# Patient Record
Sex: Female | Born: 1985 | Race: White | Hispanic: No | Smoking: Current every day smoker
Health system: Southern US, Community
[De-identification: ages and names within clinical notes are randomized; demographics above are authoritative.]

## PROBLEM LIST (undated history)

## (undated) DIAGNOSIS — E079 Disorder of thyroid, unspecified: Secondary | ICD-10-CM

---

## 2019-09-21 DIAGNOSIS — M542 Cervicalgia: Secondary | ICD-10-CM | POA: Insufficient documentation

## 2019-09-21 DIAGNOSIS — Z720 Tobacco use: Secondary | ICD-10-CM | POA: Insufficient documentation

## 2019-12-07 DIAGNOSIS — E039 Hypothyroidism, unspecified: Secondary | ICD-10-CM | POA: Insufficient documentation

## 2019-12-07 DIAGNOSIS — F410 Panic disorder [episodic paroxysmal anxiety] without agoraphobia: Secondary | ICD-10-CM | POA: Insufficient documentation

## 2020-07-28 DIAGNOSIS — F329 Major depressive disorder, single episode, unspecified: Secondary | ICD-10-CM | POA: Insufficient documentation

## 2021-01-16 DIAGNOSIS — F39 Unspecified mood [affective] disorder: Secondary | ICD-10-CM | POA: Insufficient documentation

## 2021-01-16 DIAGNOSIS — R7303 Prediabetes: Secondary | ICD-10-CM | POA: Insufficient documentation

## 2021-03-05 DIAGNOSIS — N83209 Unspecified ovarian cyst, unspecified side: Secondary | ICD-10-CM | POA: Insufficient documentation

## 2021-03-05 DIAGNOSIS — Z98891 History of uterine scar from previous surgery: Secondary | ICD-10-CM | POA: Insufficient documentation

## 2021-06-16 DIAGNOSIS — F419 Anxiety disorder, unspecified: Secondary | ICD-10-CM | POA: Insufficient documentation

## 2021-06-23 DIAGNOSIS — Z8632 Personal history of gestational diabetes: Secondary | ICD-10-CM | POA: Insufficient documentation

## 2021-06-29 DIAGNOSIS — O99119 Other diseases of the blood and blood-forming organs and certain disorders involving the immune mechanism complicating pregnancy, unspecified trimester: Secondary | ICD-10-CM | POA: Insufficient documentation

## 2021-08-27 DIAGNOSIS — G4730 Sleep apnea, unspecified: Secondary | ICD-10-CM | POA: Insufficient documentation

## 2021-12-08 DIAGNOSIS — J069 Acute upper respiratory infection, unspecified: Secondary | ICD-10-CM | POA: Insufficient documentation

## 2022-01-23 ENCOUNTER — Encounter: Payer: Self-pay | Admitting: Emergency Medicine

## 2022-01-23 ENCOUNTER — Ambulatory Visit
Admission: EM | Admit: 2022-01-23 | Discharge: 2022-01-23 | Disposition: A | Discharge status: Home / Self Care | Payer: Medicaid Other | Attending: Physician Assistant | Admitting: Physician Assistant

## 2022-01-23 ENCOUNTER — Other Ambulatory Visit: Payer: Self-pay

## 2022-01-23 ENCOUNTER — Ambulatory Visit (INDEPENDENT_AMBULATORY_CARE_PROVIDER_SITE_OTHER): Payer: Medicaid Other

## 2022-01-23 DIAGNOSIS — R509 Fever, unspecified: Secondary | ICD-10-CM

## 2022-01-23 DIAGNOSIS — J039 Acute tonsillitis, unspecified: Secondary | ICD-10-CM | POA: Diagnosis not present

## 2022-01-23 DIAGNOSIS — R059 Cough, unspecified: Secondary | ICD-10-CM

## 2022-01-23 DIAGNOSIS — R051 Acute cough: Secondary | ICD-10-CM

## 2022-01-23 DIAGNOSIS — J029 Acute pharyngitis, unspecified: Secondary | ICD-10-CM

## 2022-01-23 HISTORY — DX: Disorder of thyroid, unspecified: E07.9

## 2022-01-23 LAB — RAPID INFLUENZA A&B ANTIGENS
Influenza A (ARMC): NEGATIVE
Influenza B (ARMC): NEGATIVE

## 2022-01-23 LAB — GROUP A STREP BY PCR: Group A Strep by PCR: NOT DETECTED

## 2022-01-23 MED ORDER — AMOXICILLIN 500 MG PO CAPS
500.0000 mg | ORAL_CAPSULE | Freq: Two times a day (BID) | ORAL | 0 refills | Status: AC
Start: 2022-01-23 — End: 2022-02-02

## 2022-01-23 MED ORDER — ACETAMINOPHEN 500 MG PO TABS
1000.0000 mg | ORAL_TABLET | Freq: Once | ORAL | Status: AC
  Administered 2022-01-23: 1000 mg via ORAL

## 2022-01-23 NOTE — ED Triage Notes (Signed)
Pt c/o body aches, fever (102.7), nasal congestion, cough, headache, sore throat, and nausea. Started about 2 days ago. She states she had covid at the end of January.  ?

## 2022-01-23 NOTE — ED Provider Notes (Signed)
[facility]    CSN: [id number] Arrival date & time: 01/23/22  0954      History   Chief Complaint Chief Complaint  Patient presents with   Generalized Body Aches   Fever    HPI Dawn Grimes is a 36 y.o. female presenting for fever up to 102.7 degrees, nasal congestion, cough, headache, sore throat, nausea and body aches.  Patient says symptoms hit her all of a sudden about 2 days ago.  She reports history of COVID-19 1 month ago.  She reports her family members were sick with similar symptoms last week but were not tested for anything.  She says her sore throat is moderate.  Denies chest pain or breathing difficulty.  No vomiting or diarrhea.  No known exposure to flu or strep.  Patient taking Mucinex and ibuprofen/Tylenol for symptoms.  No other concerns.  HPI  Past Medical History:  Diagnosis Date   Thyroid disease     There are no problems to display for this patient.   Past Surgical History:  Procedure Laterality Date   CESAREAN SECTION     x2    OB History   No obstetric history on file.      Home Medications    Prior to Admission medications   Medication Sig Start Date End Date Taking? Authorizing Provider  amoxicillin (AMOXIL) 500 MG capsule Take 1 capsule (500 mg total) by mouth 2 (two) times daily for 10 days. 01/23/22 02/02/22 Yes Shirlee Latch, PA-C  busPIRone (BUSPAR) 10 MG tablet Take 20 mg by mouth 2 (two) times daily. 01/02/22  Yes [provider]  levothyroxine (SYNTHROID) 150 MCG tablet Take 150 mcg by mouth daily. 01/08/22  Yes [provider]  sertraline (ZOLOFT) 100 MG tablet Take by mouth. 01/11/22  Yes [provider]    Family History No family history on file.  Social History Social History   Tobacco Use   Smoking status: Never   Smokeless tobacco: Never  Vaping Use   Vaping Use: Never used  Substance Use Topics   Alcohol use: Not Currently   Drug use: Not Currently     Allergies   Sulfa  antibiotics   Review of Systems Review of Systems  Constitutional:  Positive for chills, fatigue and fever. Negative for diaphoresis.  HENT:  Positive for congestion, rhinorrhea and sore throat. Negative for ear pain, sinus pressure and sinus pain.   Respiratory:  Positive for cough. Negative for shortness of breath and wheezing.   Gastrointestinal:  Negative for abdominal pain, nausea and vomiting.  Musculoskeletal:  Positive for myalgias. Negative for arthralgias.  Skin:  Negative for rash.  Neurological:  Negative for weakness and headaches.  Hematological:  Negative for adenopathy.    Physical Exam Triage Vital Signs ED Triage Vitals  Enc Vitals Group     BP --      Pulse --      Resp --      Temp --      Temp src --      SpO2 --      Weight 01/23/22 1016 262 lb (118.8 kg)     Height 01/23/22 1016 5\' 5"  (1.651 m)     Head Circumference --      Peak Flow --      Pain Score 01/23/22 1015 6     Pain Loc --      Pain Edu? --      Excl. in GC? --  No data found.  Updated Vital Signs BP 125/79 (BP Location: Left Arm)    Pulse (!) 125    Temp (!) 102.5 F (39.2 C) (Oral) Comment: pt took ibuprofen just PTA   Resp 20    Ht 5\' 5"  (1.651 m)    Wt 262 lb (118.8 kg)    SpO2 95%    Breastfeeding Yes    BMI 43.60 kg/m     Physical Exam Vitals and nursing note reviewed.  Constitutional:      General: She is not in acute distress.    Appearance: Normal appearance. She is ill-appearing. She is not toxic-appearing.  HENT:     Head: Normocephalic and atraumatic.     Nose: Congestion present.     Mouth/Throat:     Mouth: Mucous membranes are moist.     Pharynx: Oropharynx is clear. Posterior oropharyngeal erythema present.     Tonsils: 1+ on the right. 1+ on the left.  Eyes:     General: No scleral icterus.       Right eye: No discharge.        Left eye: No discharge.     Conjunctiva/sclera: Conjunctivae normal.  Cardiovascular:     Rate and Rhythm: Regular rhythm.  Tachycardia present.     Heart sounds: Normal heart sounds.  Pulmonary:     Effort: Pulmonary effort is normal. No respiratory distress.     Breath sounds: Normal breath sounds.  Musculoskeletal:     Cervical back: Neck supple.  Skin:    General: Skin is dry.  Neurological:     General: No focal deficit present.     Mental Status: She is alert. Mental status is at baseline.     Motor: No weakness.     Gait: Gait normal.  Psychiatric:        Mood and Affect: Mood normal.        Behavior: Behavior normal.        Thought Content: Thought content normal.     UC Treatments / Results  Labs (all labs ordered are listed, but only abnormal results are displayed) Labs Reviewed  GROUP A STREP BY PCR  RAPID INFLUENZA A&B ANTIGENS  CULTURE, GROUP A STREP [facility])    EKG   Radiology DG Chest 2 View  Result Date: 01/23/2022 CLINICAL DATA:  Fever, cough and body aches. History of COVID 1 month ago. EXAM: CHEST - 2 VIEW COMPARISON:  None. FINDINGS: The heart size and mediastinal contours are within normal limits. Both lungs are clear. The visualized skeletal structures are unremarkable. IMPRESSION: No active cardiopulmonary disease. Electronically Signed   By: Signa Kell M.D.   On: 01/23/2022 11:25    Procedures Procedures (including critical care time)  Medications Ordered in UC Medications  acetaminophen (TYLENOL) tablet 1,000 mg (1,000 mg Oral Given 01/23/22 1106)    Initial Impression / Assessment and Plan / UC Course  I have reviewed the triage vital signs and the nursing notes.  Pertinent labs & imaging results that were available during my care of the patient were reviewed by me and considered in my medical decision making (see chart for details).  36 year old female presenting for 2-day history of fever, fatigue, body aches, cough, congestion and sore throat.  Temp 102.5 degrees.  Pulse elevated at 125 bpm.  Patient ill-appearing but nontoxic.  On exam she has mild nasal  congestion.  Positive erythema posterior pharynx with 1+ bilateral enlarged tonsils.  Chest clear to auscultation.  Rapid flu  test negative. PCR strep test negative.  Culture sent.  Chest x-ray ordered due to fever and cough as well as slightly decreased O2 sat at 95%.  X-ray independently reviewed by me.  Overread is normal.  Discussed it with patient.  Suspect patient's symptoms may be due to a viral illness versus false negative on the strep test especially given her symptoms and high prevalence of strep in the community.  Discussed with patient holding off on antibiotics until culture result versus treating now and possibly being advised to stop the medication in a couple of days with cultures negative.  Patient says she feels awful now and would like to go ahead and start the antibiotic.  Prescribed amoxicillin.  Offered cough medicine but she says she will take Mucinex.  Encouraged her to continue with the antibiotics.  Encouraged increasing rest and fluid intake.  Reviewed return and ER precautions.   Final Clinical Impressions(s) / UC Diagnoses   Final diagnoses:  Acute tonsillitis, unspecified etiology  Fever, unspecified  Sore throat  Acute cough     Discharge Instructions      -Negative flu.  Normal chest x-ray - You would not test positive for COVID due to a new infection again this soon.  Its only been a month.  If we did test you it may still be a continued positive from your previous infections over holding off. - Strep test is negative but worsening of culture.  I suspect it may be a false negative given your high fever, appearance of your throat and high prevalence of strep in the community at this time.  I have sent antibiotics to pharmacy.  Someone may call you in a couple days and tell you you may not need to take it if your culture is negative.  If negative, this is likely another viral flulike illness which should run its course within a week or so. - Ibuprofen and/or  Tylenol for fever control. - You need to be seen again for any severe acute worsening of your symptoms, uncontrollable fever, weakness, chest pain, breathing trouble, new symptoms such as abdominal pain, vomiting.     ED Prescriptions     Medication Sig Dispense Auth. Provider   amoxicillin (AMOXIL) 500 MG capsule Take 1 capsule (500 mg total) by mouth 2 (two) times daily for 10 days. 20 capsule Shirlee Latch, PA-C      PDMP not reviewed this encounter.   Shirlee Latch, PA-C 01/23/22 1141

## 2022-01-23 NOTE — Discharge Instructions (Signed)
-  Negative flu.  Normal chest x-ray ?- You would not test positive for COVID due to a new infection again this soon.  Its only been a month.  If we did test you it may still be a continued positive from your previous infections over holding off. ?- Strep test is negative but worsening of culture.  I suspect it may be a false negative given your high fever, appearance of your throat and high prevalence of strep in the community at this time.  I have sent antibiotics to pharmacy.  Someone may call you in a couple days and tell you you may not need to take it if your culture is negative.  If negative, this is likely another viral flulike illness which should run its course within a week or so. ?- Ibuprofen and/or Tylenol for fever control. ?- You need to be seen again for any severe acute worsening of your symptoms, uncontrollable fever, weakness, chest pain, breathing trouble, new symptoms such as abdominal pain, vomiting. ?

## 2022-01-26 LAB — CULTURE, GROUP A STREP (THRC)

## 2022-12-06 ENCOUNTER — Ambulatory Visit: Payer: Medicaid Other

## 2023-05-21 DIAGNOSIS — F439 Reaction to severe stress, unspecified: Secondary | ICD-10-CM | POA: Insufficient documentation

## 2023-05-28 DIAGNOSIS — W19XXXA Unspecified fall, initial encounter: Secondary | ICD-10-CM | POA: Insufficient documentation

## 2023-06-01 DIAGNOSIS — F1994 Other psychoactive substance use, unspecified with psychoactive substance-induced mood disorder: Secondary | ICD-10-CM | POA: Insufficient documentation

## 2023-06-08 DIAGNOSIS — F1590 Other stimulant use, unspecified, uncomplicated: Secondary | ICD-10-CM | POA: Insufficient documentation

## 2023-06-08 DIAGNOSIS — F1290 Cannabis use, unspecified, uncomplicated: Secondary | ICD-10-CM | POA: Insufficient documentation

## 2023-07-17 IMAGING — CR DG CHEST 2V
2 series · 2 of 2 positions shown · non-contrast
Comparison: None.

CLINICAL DATA: Fever, cough and body aches. History of COVID 1
month ago.

EXAM:
CHEST - 2 VIEW

[chest pa]
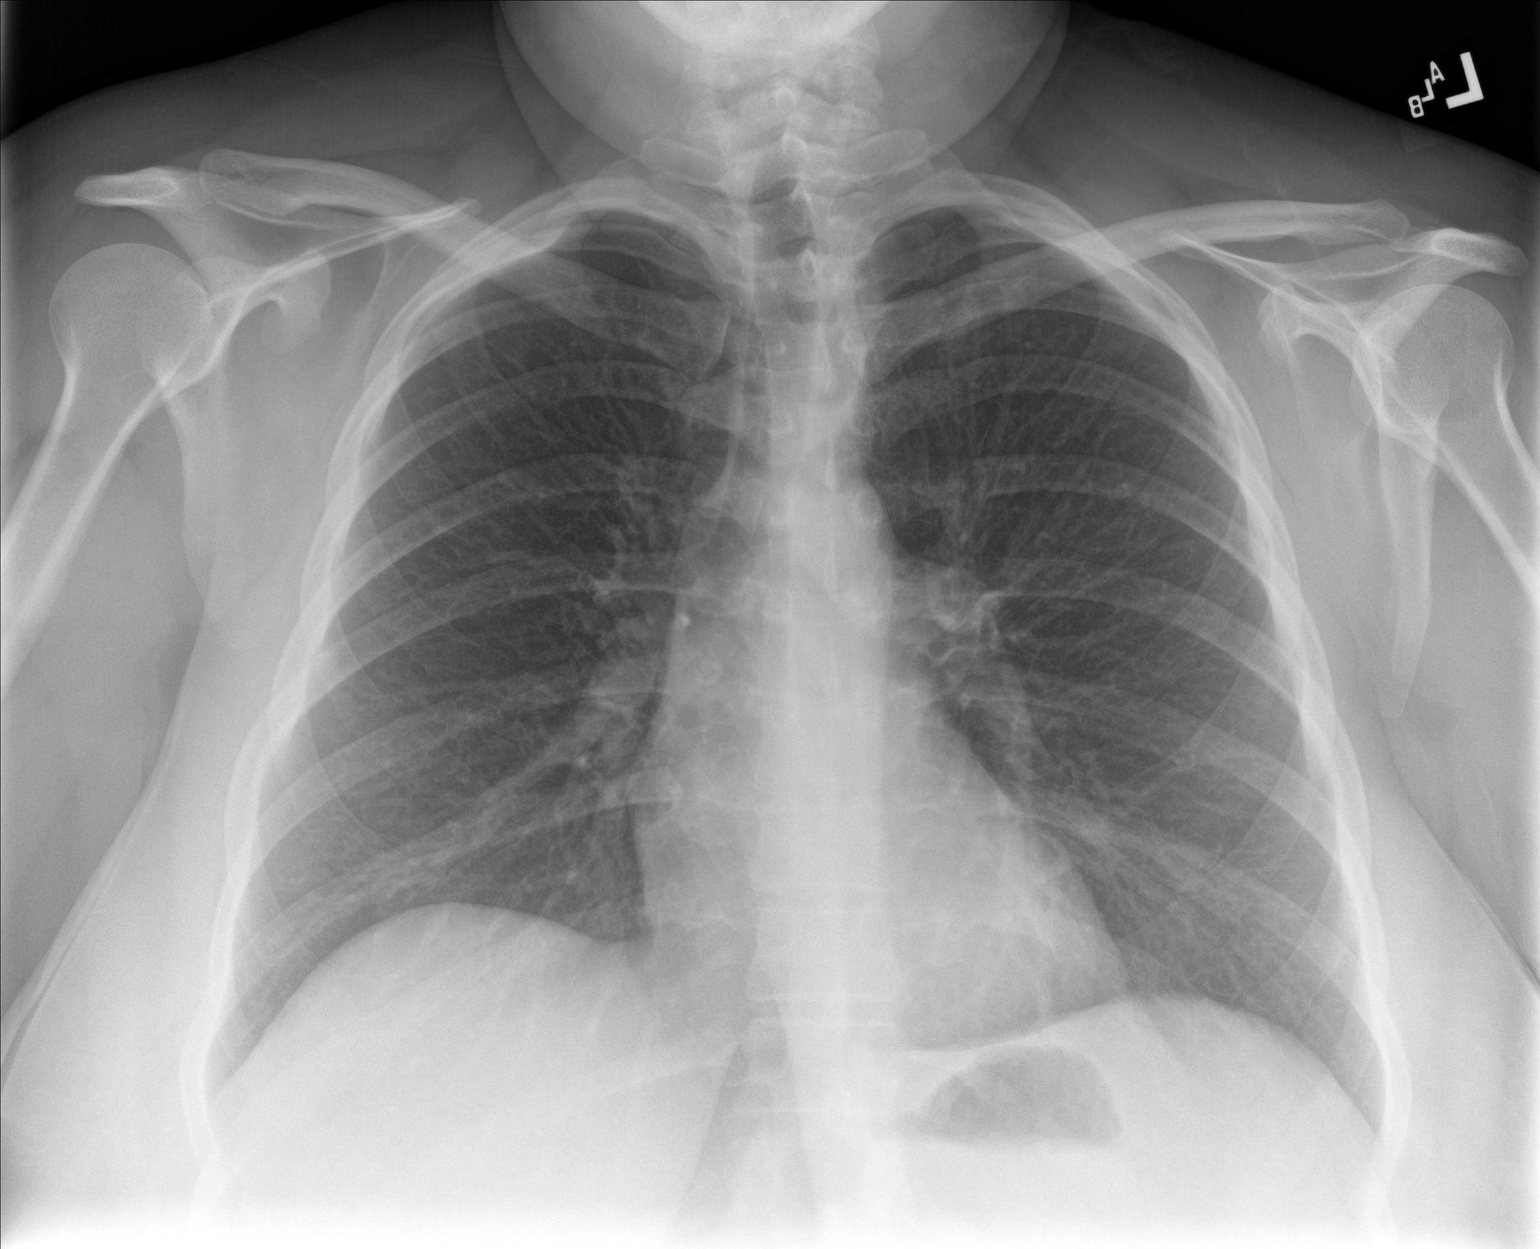

[chest lat]
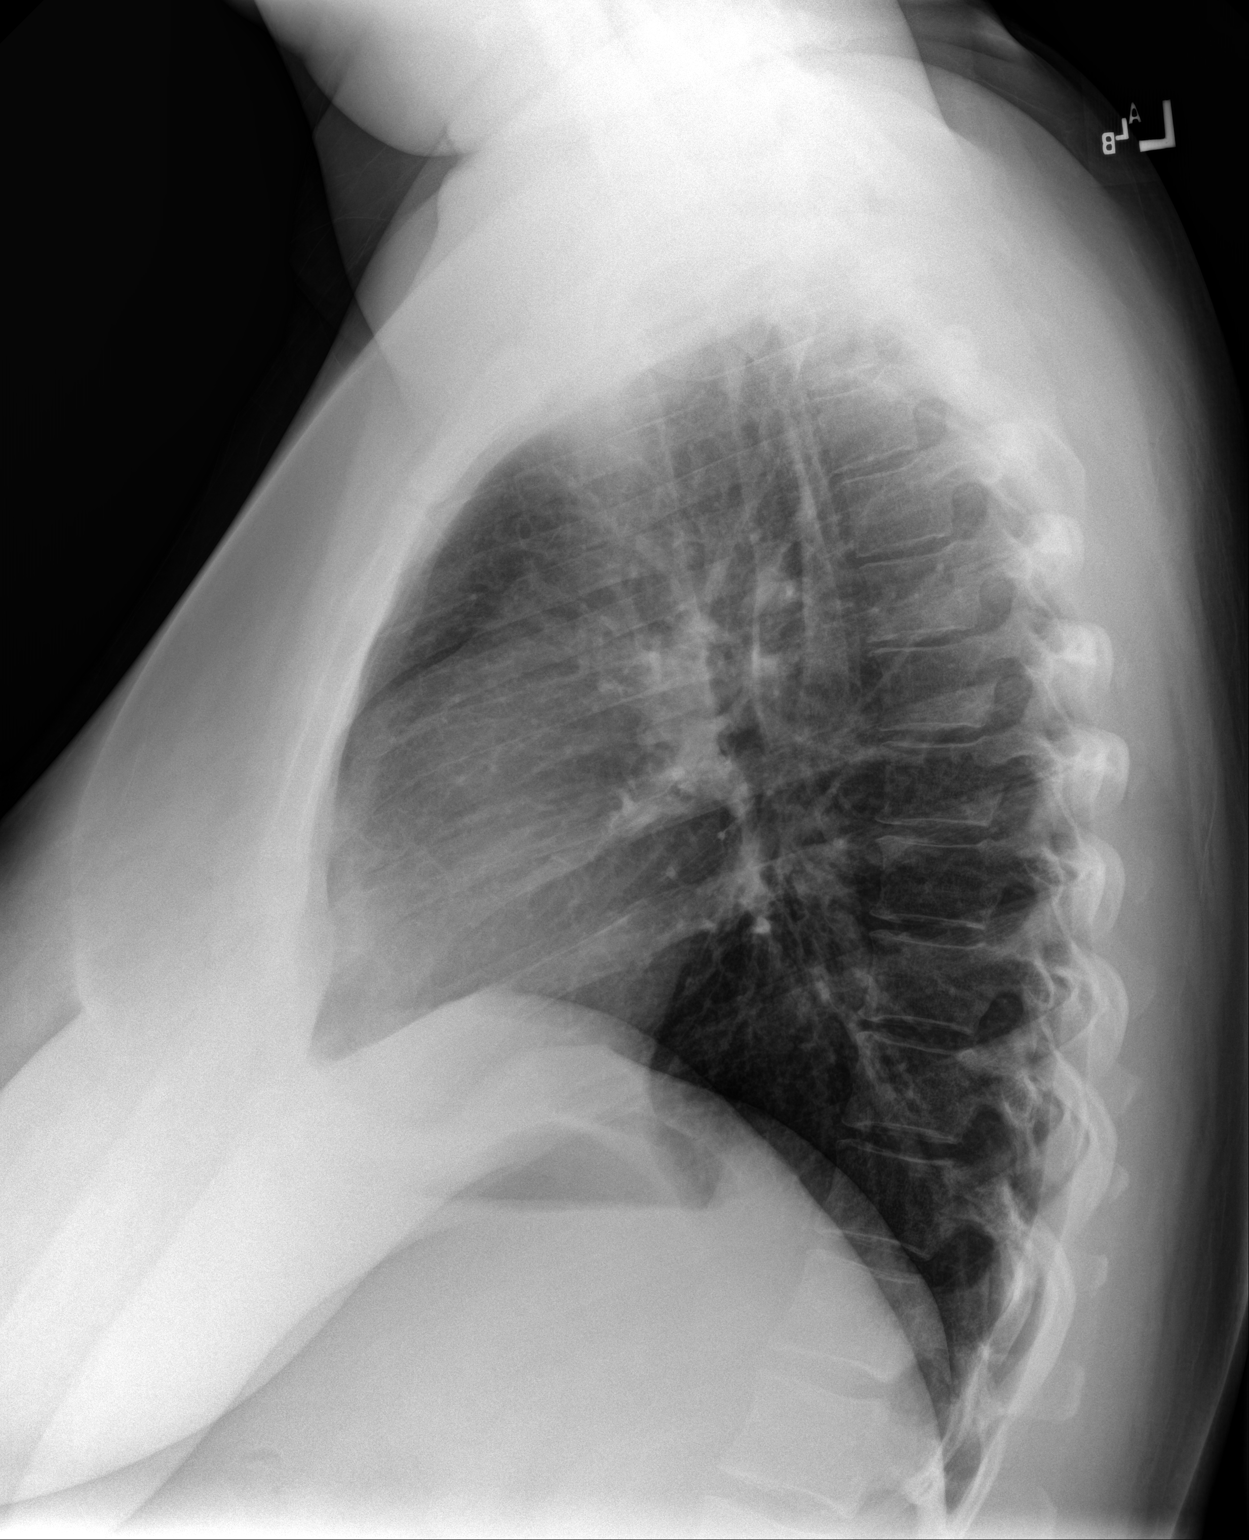

[2 of 2 positions shown; findings below may reference images not displayed]

FINDINGS: The heart size and mediastinal contours are within normal limits.
Both lungs are clear. The visualized skeletal structures are
unremarkable.
IMPRESSION: No active cardiopulmonary disease.

## 2023-08-16 ENCOUNTER — Ambulatory Visit (INDEPENDENT_AMBULATORY_CARE_PROVIDER_SITE_OTHER): Payer: Managed Care, Other (non HMO)

## 2023-08-16 ENCOUNTER — Ambulatory Visit
Admission: EM | Admit: 2023-08-16 | Discharge: 2023-08-16 | Disposition: A | Discharge status: Home / Self Care | Payer: Managed Care, Other (non HMO) | Attending: Emergency Medicine | Admitting: Emergency Medicine

## 2023-08-16 DIAGNOSIS — J209 Acute bronchitis, unspecified: Secondary | ICD-10-CM | POA: Diagnosis present

## 2023-08-16 DIAGNOSIS — H6502 Acute serous otitis media, left ear: Secondary | ICD-10-CM | POA: Diagnosis present

## 2023-08-16 DIAGNOSIS — Z20822 Contact with and (suspected) exposure to covid-19: Secondary | ICD-10-CM | POA: Diagnosis present

## 2023-08-16 DIAGNOSIS — J4541 Moderate persistent asthma with (acute) exacerbation: Secondary | ICD-10-CM

## 2023-08-16 LAB — RESP PANEL BY RT-PCR (RSV, FLU A&B, COVID)  RVPGX2
Influenza A by PCR: NEGATIVE
Influenza B by PCR: NEGATIVE
Resp Syncytial Virus by PCR: NEGATIVE
SARS Coronavirus 2 by RT PCR: NEGATIVE

## 2023-08-16 LAB — GROUP A STREP BY PCR: Group A Strep by PCR: NOT DETECTED

## 2023-08-16 MED ORDER — NAPROXEN 500 MG PO TABS: 500.0000 mg | ORAL_TABLET | Freq: Two times a day (BID) | ORAL | 0 refills | Status: AC

## 2023-08-16 MED ORDER — ALBUTEROL SULFATE HFA 108 (90 BASE) MCG/ACT IN AERS
1.0000 | INHALATION_SPRAY | RESPIRATORY_TRACT | 0 refills | Status: DC | PRN
Start: 2023-08-16 — End: 2023-11-02

## 2023-08-16 MED ORDER — FLUTICASONE PROPIONATE 50 MCG/ACT NA SUSP: 2.0000 | Freq: Every day | NASAL | 0 refills | Status: AC

## 2023-08-16 MED ORDER — PREDNISONE 20 MG PO TABS: 40.0000 mg | ORAL_TABLET | Freq: Every day | ORAL | 0 refills | Status: AC

## 2023-08-16 MED ORDER — ALBUTEROL SULFATE (2.5 MG/3ML) 0.083% IN NEBU
2.5000 mg | INHALATION_SOLUTION | Freq: Once | RESPIRATORY_TRACT | Status: AC
  Administered 2023-08-16: 2.5 mg via RESPIRATORY_TRACT

## 2023-08-16 MED ORDER — AEROCHAMBER MV MISC: 1 refills | Status: AC

## 2023-08-16 MED ORDER — PROMETHAZINE-DM 6.25-15 MG/5ML PO SYRP: 5.0000 mL | ORAL_SOLUTION | Freq: Four times a day (QID) | ORAL | 0 refills | Status: DC | PRN

## 2023-08-16 MED ORDER — IPRATROPIUM-ALBUTEROL 0.5-2.5 (3) MG/3ML IN SOLN
3.0000 mL | Freq: Once | RESPIRATORY_TRACT | Status: AC
  Administered 2023-08-16: 3 mL via RESPIRATORY_TRACT

## 2023-08-16 NOTE — ED Provider Notes (Addendum)
HPI  SUBJECTIVE:  Dawn Grimes is a 37 y.o. female who presents with 3 days of nasal and chest congestion, body aches, headaches, clear rhinorrhea, right ear pain, postnasal drip, cough productive of clear/green and yellow sputum, wheezing, dyspnea on exertion and chest soreness secondary to the cough.  She is unable to sleep at night because of the cough.  No fevers, sinus pain or pressure, change in hearing, otorrhea, sore throat, loss sense of smell or taste, shortness of breath at rest, nausea, vomiting, diarrhea, abdominal pain.  No known COVID or flu exposure.  She got the COVID and flu vaccines.  She had negative home COVID test today.  One of her children has strep, she states that her other child is sick "with something".  No antibiotics in the past 3 months.  No antipyretic in the past 6 hours.  She has tried hot liquids, is using her albuterol every 4 hours, normally uses it as needed, and is taking her Symbicort.  The albuterol helps.  Symptoms worse with lying down at night.  She has a past medical history of asthma, is a smoker, hypothyroidism and depression.  No admissions for asthma.  LMP: Now.  Denies the possibility of being pregnant.  PCP: [facility].    Past Medical History:  Diagnosis Date   Thyroid disease     Past Surgical History:  Procedure Laterality Date   CESAREAN SECTION     x2    History reviewed. No pertinent family history.  Social History   Tobacco Use   Smoking status: Never   Smokeless tobacco: Never  Vaping Use   Vaping status: Never Used  Substance Use Topics   Alcohol use: Not Currently   Drug use: Not Currently    No current facility-administered medications for this encounter.  Current Outpatient Medications:    albuterol (VENTOLIN HFA) 108 (90 Base) MCG/ACT inhaler, Inhale 1-2 puffs into the lungs every 4 (four) hours as needed for wheezing or shortness of breath., Disp: 1 each, Rfl: 0   busPIRone (BUSPAR) 10 MG tablet, Take 20 mg  by mouth 2 (two) times daily., Disp: , Rfl:    fluticasone (FLONASE) 50 MCG/ACT nasal spray, Place 2 sprays into both nostrils daily., Disp: 16 g, Rfl: 0   levothyroxine (SYNTHROID) 150 MCG tablet, Take 150 mcg by mouth daily., Disp: , Rfl:    mirtazapine (REMERON) 15 MG tablet, Take by mouth., Disp: , Rfl:    naproxen (NAPROSYN) 500 MG tablet, Take 1 tablet (500 mg total) by mouth 2 (two) times daily., Disp: 20 tablet, Rfl: 0   predniSONE (DELTASONE) 20 MG tablet, Take 2 tablets (40 mg total) by mouth daily with breakfast for 5 days., Disp: 10 tablet, Rfl: 0   promethazine-dextromethorphan (PROMETHAZINE-DM) 6.25-15 MG/5ML syrup, Take 5 mLs by mouth 4 (four) times daily as needed for cough., Disp: 118 mL, Rfl: 0   sertraline (ZOLOFT) 100 MG tablet, Take by mouth., Disp: , Rfl:    Spacer/Aero-Holding Chambers (AEROCHAMBER MV) inhaler, Use as instructed, Disp: 1 each, Rfl: 1   divalproex (DEPAKOTE) 500 MG DR tablet, Take 500 mg by mouth 2 (two) times daily., Disp: , Rfl:   Allergies  Allergen Reactions   Nickel     Rashes, edema in area of exposure   Sulfa Antibiotics     Unsure on reaction     Got as an infant     ROS  As noted in HPI.   Physical Exam  BP 112/79 (BP  Location: Right Arm)   Pulse 89   Temp 98.8 F (37.1 C) (Oral)   LMP 07/16/2023 (Approximate)   SpO2 95%   Constitutional: Well developed, well nourished, no acute distress.  Coughing. Eyes:  EOMI, conjunctiva normal bilaterally HENT: Normocephalic, atraumatic,mucus membranes moist positive nasal congestion, erythematous, swollen turbinates.  No maxillary, frontal sinus tenderness.  Normal oropharynx.  No obvious postnasal drip. Hearing intact and equal bilaterally.  Right ear: External ear, EAC, TM normal.  No pain with traction on pinna, palpation of tragus or mastoid.  No tenderness over the TMJ Left ear: External ear, EAC normal.  TM erythematous with serous effusion.  No dullness or bulging.  No pain with  palpation of tragus or mastoid.  No tenderness over the TMJ.   Neck: Positive cervical lymphadenopathy. Respiratory: Normal inspiratory effort, diffuse expiratory wheezing throughout all lung fields, fair air movement.  Positive anterior chest wall tenderness Cardiovascular: Normal rate, regular rhythm, no murmurs rubs or gallops GI: nondistended skin: No rash, skin intact Musculoskeletal: no deformities Neurologic: Alert & oriented x 3, no focal neuro deficits Psychiatric: Speech and behavior appropriate   ED Course   Medications  albuterol (PROVENTIL) (2.5 MG/3ML) 0.083% nebulizer solution 2.5 mg (2.5 mg Nebulization Given 08/16/23 1743)  ipratropium-albuterol (DUONEB) 0.5-2.5 (3) MG/3ML nebulizer solution 3 mL (3 mLs Nebulization Given 08/16/23 1743)    Orders Placed This Encounter  Procedures   Group A Strep by PCR    Standing Status:   Standing    Number of Occurrences:   1   Resp panel by RT-PCR (RSV, Flu A&B, Covid) Anterior Nasal Swab    Standing Status:   Standing    Number of Occurrences:   1   DG Chest 2 View    Standing Status:   Standing    Number of Occurrences:   1    Order Specific Question:   Reason for Exam (SYMPTOM  OR DIAGNOSIS REQUIRED)    Answer:   Cough, wheeze, rule out pneumonia    Results for orders placed or performed during the hospital encounter of 08/16/23 (from the past 24 hour(s))  Group A Strep by PCR     Status: None   Collection Time: 08/16/23  4:39 PM   Specimen: Throat; Sterile Swab  Result Value Ref Range   Group A Strep by PCR NOT DETECTED NOT DETECTED  Resp panel by RT-PCR (RSV, Flu A&B, Covid) Anterior Nasal Swab     Status: None   Collection Time: 08/16/23  5:20 PM   Specimen: Anterior Nasal Swab  Result Value Ref Range   SARS Coronavirus 2 by RT PCR NEGATIVE NEGATIVE   Influenza A by PCR NEGATIVE NEGATIVE   Influenza B by PCR NEGATIVE NEGATIVE   Resp Syncytial Virus by PCR NEGATIVE NEGATIVE   DG Chest 2 View  Result Date:  08/16/2023 CLINICAL DATA:  Cough and wheezing.  Rule out pneumonia. EXAM: CHEST - 2 VIEW COMPARISON:  01/24/2019 FINDINGS: The cardiomediastinal contours are normal. Minor bronchial thickening. Pulmonary vasculature is normal. No consolidation, pleural effusion, or pneumothorax. No acute osseous abnormalities are seen. IMPRESSION: Minor bronchial thickening without focal consolidation. Electronically Signed   By: Narda Rutherford M.D.   On: 08/16/2023 17:47    ED Clinical Impression  1. Acute bronchitis, unspecified organism   2. Moderate persistent asthma with exacerbation   3. Encounter for laboratory testing for COVID-19 virus   4. Non-recurrent acute serous otitis media of left ear      ED  Assessment/Plan     Checking COVID, flu, RSV. strep was ordered in triage.  Chest x-Dawn to rule out pneumonia.  Will give 5/0.5 mg of albuterol/DuoNeb given that she has been using her albuterol inhaler every 4 hours without much improvement and reevaluate.  GFR from labs done in July 2024 above 90  Reviewed imaging independently.  Bronchial thickening without focal consolidation.  See radiology report for details.  On reevaluation, patient states that she feels better.  Improved air movement, loud wheezing.  Presentation consistent with a bronchitis.  She also has a left-sided serous otitis media.  There does not appear to be a pneumonia on x-Dawn.  Will send home with regularly scheduled albuterol inhaler with a spacer for 4 days, then as needed thereafter, Flonase saline nasal irrigation Mucinex d, prednisone 40 mg for 5 days and Promethazine DM cough syrup, Naprosyn/Tylenol.  Work note for 2 days.    She does not want antivirals if COVID or flu are positive.  Follow-up with PCP as needed.  ER return precautions given.  COVID, flu, RSV and strep negative.  Plan as above.  Discussed labs, imaging, MDM, treatment plan, and plan for follow-up with patient. Discussed sn/sx that should prompt return to  the ED. patient agrees with plan.   Meds ordered this encounter  Medications   albuterol (PROVENTIL) (2.5 MG/3ML) 0.083% nebulizer solution 2.5 mg   ipratropium-albuterol (DUONEB) 0.5-2.5 (3) MG/3ML nebulizer solution 3 mL   predniSONE (DELTASONE) 20 MG tablet    Sig: Take 2 tablets (40 mg total) by mouth daily with breakfast for 5 days.    Dispense:  10 tablet    Refill:  0   promethazine-dextromethorphan (PROMETHAZINE-DM) 6.25-15 MG/5ML syrup    Sig: Take 5 mLs by mouth 4 (four) times daily as needed for cough.    Dispense:  118 mL    Refill:  0   albuterol (VENTOLIN HFA) 108 (90 Base) MCG/ACT inhaler    Sig: Inhale 1-2 puffs into the lungs every 4 (four) hours as needed for wheezing or shortness of breath.    Dispense:  1 each    Refill:  0   fluticasone (FLONASE) 50 MCG/ACT nasal spray    Sig: Place 2 sprays into both nostrils daily.    Dispense:  16 g    Refill:  0   Spacer/Aero-Holding Chambers (AEROCHAMBER MV) inhaler    Sig: Use as instructed    Dispense:  1 each    Refill:  1   naproxen (NAPROSYN) 500 MG tablet    Sig: Take 1 tablet (500 mg total) by mouth 2 (two) times daily.    Dispense:  20 tablet    Refill:  0      *This clinic note was created using Scientist, clinical (histocompatibility and immunogenetics). Therefore, there may be occasional mistakes despite careful proofreading.  ?    Domenick Gong, MD 08/17/23 0800    Domenick Gong, MD 08/17/23 [id number]

## 2023-08-16 NOTE — ED Triage Notes (Addendum)
Pt presents to UC c/o cough, chest congestion onset x3-4 days ago, pt states she has not tried OTC meds, no known fevers. Pt states she was exposed to strep from her kids.

## 2023-08-16 NOTE — Discharge Instructions (Signed)
Your chest x-ray is negative for pneumonia.  You appear to have bronchitis.  We will contact you if your COVID or flu come back positive.  2 puffs from your albuterol inhaler every 4 hours for 2 days, then every 6 hours for 2 days, then as needed.  You can back off on the albuterol if you start to improve sooner.  Finish the prednisone, even if you feel better.  Flonase, saline nasal irrigation with a NeilMed rinse and distilled water as often as you want, Mucinex D for the nasal and chest congestion.  Naprosyn with 1000 mg of Tylenol twice a day for the chest wall pain.  Promethazine DM for the cough.  Stop all other cold medications.

## 2023-11-02 ENCOUNTER — Ambulatory Visit
Admission: EM | Admit: 2023-11-02 | Discharge: 2023-11-02 | Disposition: A | Discharge status: Home / Self Care | Payer: Managed Care, Other (non HMO) | Attending: Family Medicine | Admitting: Family Medicine

## 2023-11-02 ENCOUNTER — Ambulatory Visit: Payer: Managed Care, Other (non HMO)

## 2023-11-02 DIAGNOSIS — R051 Acute cough: Secondary | ICD-10-CM | POA: Diagnosis not present

## 2023-11-02 DIAGNOSIS — H66003 Acute suppurative otitis media without spontaneous rupture of ear drum, bilateral: Secondary | ICD-10-CM | POA: Diagnosis not present

## 2023-11-02 MED ORDER — ONDANSETRON 4 MG PO TBDP: 4.0000 mg | ORAL_TABLET | Freq: Three times a day (TID) | ORAL | 0 refills | Status: DC | PRN

## 2023-11-02 MED ORDER — PROMETHAZINE-DM 6.25-15 MG/5ML PO SYRP: 5.0000 mL | ORAL_SOLUTION | Freq: Four times a day (QID) | ORAL | 0 refills | Status: DC | PRN

## 2023-11-02 MED ORDER — AMOXICILLIN-POT CLAVULANATE 875-125 MG PO TABS: 1.0000 | ORAL_TABLET | Freq: Two times a day (BID) | ORAL | 0 refills | Status: DC

## 2023-11-02 MED ORDER — ALBUTEROL SULFATE HFA 108 (90 BASE) MCG/ACT IN AERS
1.0000 | INHALATION_SPRAY | RESPIRATORY_TRACT | 0 refills | Status: AC | PRN
Start: 2023-11-02 — End: ?

## 2023-11-02 NOTE — Discharge Instructions (Addendum)
 Stop by the pharmacy to pick up your prescriptions.  Follow up with your primary care provider as needed. Your chest xray did not show evidence of pneumonia though the radiologist has not yet read it. If they find something that I didn't, I will call you.

## 2023-11-02 NOTE — ED Triage Notes (Signed)
Sx started 1 week  Nausea Vomiting Bilateral ear pain Runny nose Cough Unsure of fever.   Nothing OTC  LMP 10/10/2023

## 2023-11-02 NOTE — ED Provider Notes (Signed)
[facility]    CSN: [id number] Arrival date & time: 11/02/23  1617      History   Chief Complaint Chief Complaint  Patient presents with   Cough   Otalgia   Nausea   Emesis    HPI Leontina Terhorst is a 37 y.o. female.   HPI  History obtained from the patient. Shanicia presents for cough,  nausea and vomiting. Has itchiness and blocked feeling in her ears for a cough weeks.  Has been coughing for a couple weeks. Has non-bloody sputum.  No fever, chills, diarrhea and abdominal pain. Has intermittent body aches.   Endorses headache, rhinorrhea and nasal congestion.  Denies known sick contacts.    Has vomiting and nausea that started on Friday. Patient's last menstrual period was 10/05/2023 (exact date). This was not a normal period as it was very heavy and lasted longer than normal. Had a tubal ligation 2022.   Smokes and vapes.      Past Medical History:  Diagnosis Date   Thyroid disease     There are no problems to display for this patient.   Past Surgical History:  Procedure Laterality Date   CESAREAN SECTION     x2    OB History   No obstetric history on file.      Home Medications    Prior to Admission medications   Medication Sig Start Date End Date Taking? Authorizing Provider  amoxicillin-clavulanate (AUGMENTIN) 875-125 MG tablet Take 1 tablet by mouth every 12 (twelve) hours. 11/02/23  Yes Vonetta Foulk, Seward Meth, DO  levothyroxine (SYNTHROID) 150 MCG tablet Take 150 mcg by mouth daily. 01/08/22  Yes [provider]  ondansetron (ZOFRAN-ODT) 4 MG disintegrating tablet Take 1 tablet (4 mg total) by mouth every 8 (eight) hours as needed. 11/02/23  Yes Hagan Maltz, DO  albuterol (VENTOLIN HFA) 108 (90 Base) MCG/ACT inhaler Inhale 1-2 puffs into the lungs every 4 (four) hours as needed for wheezing or shortness of breath. 11/02/23   Ryah Cribb, Seward Meth, DO  busPIRone (BUSPAR) 10 MG tablet Take 20 mg by mouth 2 (two) times daily. 01/02/22    [provider]  divalproex (DEPAKOTE) 500 MG DR tablet Take 500 mg by mouth 2 (two) times daily.    [provider]  fluticasone (FLONASE) 50 MCG/ACT nasal spray Place 2 sprays into both nostrils daily. 08/16/23   Domenick Gong, MD  mirtazapine (REMERON) 15 MG tablet Take by mouth. 07/15/23   [provider]  naproxen (NAPROSYN) 500 MG tablet Take 1 tablet (500 mg total) by mouth 2 (two) times daily. 08/16/23   Domenick Gong, MD  promethazine-dextromethorphan (PROMETHAZINE-DM) 6.25-15 MG/5ML syrup Take 5 mLs by mouth 4 (four) times daily as needed for cough. 11/02/23   Katha Cabal, DO  sertraline (ZOLOFT) 100 MG tablet Take by mouth. 01/11/22   [provider]  Spacer/Aero-Holding Chambers (AEROCHAMBER MV) inhaler Use as instructed 08/16/23   Domenick Gong, MD    Family History History reviewed. No pertinent family history.  Social History Social History   Tobacco Use   Smoking status: Every Day    Types: Cigarettes    Passive exposure: Never   Smokeless tobacco: Never  Vaping Use   Vaping status: Never Used  Substance Use Topics   Alcohol use: Not Currently   Drug use: Not Currently     Allergies   Nickel and Sulfa antibiotics   Review of Systems Review of Systems: negative unless otherwise stated in HPI.  Physical Exam Triage Vital Signs ED Triage Vitals  Encounter Vitals Group     BP 11/02/23 1656 122/86     Systolic BP Percentile --      Diastolic BP Percentile --      Pulse Rate 11/02/23 1656 72     Resp 11/02/23 1656 20     Temp 11/02/23 1656 98.4 F (36.9 C)     Temp Source 11/02/23 1656 Oral     SpO2 11/02/23 1656 94 %     Weight --      Height --      Head Circumference --      Peak Flow --      Pain Score 11/02/23 1655 3     Pain Loc --      Pain Education --      Exclude from Growth Chart --    No data found.  Updated Vital Signs BP 122/86 (BP Location: Right Arm)   Pulse 72   Temp 98.4 F  (36.9 C) (Oral)   Resp 20   LMP 10/05/2023 (Exact Date)   SpO2 94%   Visual Acuity Right Eye Distance:   Left Eye Distance:   Bilateral Distance:    Right Eye Near:   Left Eye Near:    Bilateral Near:     Physical Exam GEN:     alert, non-toxic appearing female in no distress    HENT:  mucus membranes moist, oropharyngeal without lesions or erythema, no tonsillar hypertrophy or exudates, clear nasal discharge, bilateral TM erythematous and opaque EYES:   no scleral injection or discharge RESP:  no increased work of breathing, rhonchi that clear with cough, no rales or wheezing ABD: soft, non-tender, no guarding, no rebound  CVS:   regular rate and rhythm Skin:   warm and dry    UC Treatments / Results  Labs (all labs ordered are listed, but only abnormal results are displayed) Labs Reviewed - No data to display  EKG   Radiology DG Chest 2 View  Result Date: 11/02/2023 CLINICAL DATA:  Prior to cough for 2-3 weeks. EXAM: CHEST - 2 VIEW COMPARISON:  Chest radiographs 08/16/2023, 01/23/2022 FINDINGS: Cardiac silhouette and mediastinal contours are within normal limits. There is again mild = prominence of the central pulmonary vasculature. The lungs are clear. No pleural effusion or pneumothorax. No acute skeletal abnormality. IMPRESSION: No active cardiopulmonary disease. Electronically Signed   By: Neita Garnet M.D.   On: 11/02/2023 17:50    Procedures Procedures (including critical care time)  Medications Ordered in UC Medications - No data to display  Initial Impression / Assessment and Plan / UC Course  I have reviewed the triage vital signs and the nursing notes.  Pertinent labs & imaging results that were available during my care of the patient were reviewed by me and considered in my medical decision making (see chart for details).      Pt is a 37 y.o. female who presents for 2-3 weeks of cough that is not improving.  Terrylynn is afebrile here without recent  antipyretics. Satting 94%  on room air. Overall pt is  non-toxic appearing, well hydrated, without respiratory distress. Pulmonary exam is remarkable for rhonchi that clear with cough.  After shared decision making, we will  pursue chest x-ray.  COVID  and influenza testing deferred due to length of symptoms.    Chest xray personally reviewed by me without focal pneumonia, pleural effusion, cardiomegaly or pneumothorax. Patient aware the radiologist has  not read her xray and is comfortable with the preliminary read by me. Will review radiologist read when available and call patient if a change in plan is warranted.  Pt agreeable to this plan prior to discharge.   Treat acute bronchitis with  antibiotics as below.  Promethazine DM cough syrup given for cough and allow patient to rest.  Typical duration of symptoms discussed.   She has evidence of bilateral otitis media. Treat with Augmentin 875 mg BID for 7 days.   Zofran for vomiting. Abdominal exam unremarkable. She had a tubal therefore doubt pregnant. No fever to suggest acute abdominal process.   Return and ED precautions given and patient voiced understanding.  Discussed MDM, treatment plan and plan for follow-up with patient who agrees with plan.    Radiologist impression reviewed.  Final Clinical Impressions(s) / UC Diagnoses   Final diagnoses:  Non-recurrent acute suppurative otitis media of both ears without spontaneous rupture of tympanic membranes  Acute cough     Discharge Instructions      Stop by the pharmacy to pick up your prescriptions.  Follow up with your primary care provider as needed.  Your chest xray did not show evidence of pneumonia though the radiologist has not yet read it. If they find something that I didn't, I will call you.           ED Prescriptions     Medication Sig Dispense Auth. Provider   albuterol (VENTOLIN HFA) 108 (90 Base) MCG/ACT inhaler Inhale 1-2 puffs into the lungs every 4 (four)  hours as needed for wheezing or shortness of breath. 1 each Jomes Giraldo, DO   amoxicillin-clavulanate (AUGMENTIN) 875-125 MG tablet Take 1 tablet by mouth every 12 (twelve) hours. 14 tablet Tarius Stangelo, DO   ondansetron (ZOFRAN-ODT) 4 MG disintegrating tablet Take 1 tablet (4 mg total) by mouth every 8 (eight) hours as needed. 20 tablet Daequan Kozma, DO   promethazine-dextromethorphan (PROMETHAZINE-DM) 6.25-15 MG/5ML syrup Take 5 mLs by mouth 4 (four) times daily as needed for cough. 118 mL Katha Cabal, DO      PDMP not reviewed this encounter.   Katha Cabal, DO 11/02/23 1809

## 2024-01-08 ENCOUNTER — Other Ambulatory Visit: Payer: Self-pay

## 2024-01-08 ENCOUNTER — Emergency Department
Admission: EM | Admit: 2024-01-08 | Discharge: 2024-01-08 | Disposition: A | Discharge status: Home / Self Care | Payer: 59 | Attending: Emergency Medicine | Admitting: Emergency Medicine

## 2024-01-08 ENCOUNTER — Emergency Department: Payer: 59

## 2024-01-08 DIAGNOSIS — R1013 Epigastric pain: Secondary | ICD-10-CM | POA: Diagnosis not present

## 2024-01-08 DIAGNOSIS — M791 Myalgia, unspecified site: Secondary | ICD-10-CM | POA: Diagnosis not present

## 2024-01-08 DIAGNOSIS — R0789 Other chest pain: Secondary | ICD-10-CM | POA: Diagnosis not present

## 2024-01-08 DIAGNOSIS — R197 Diarrhea, unspecified: Secondary | ICD-10-CM | POA: Insufficient documentation

## 2024-01-08 DIAGNOSIS — R112 Nausea with vomiting, unspecified: Secondary | ICD-10-CM | POA: Insufficient documentation

## 2024-01-08 DIAGNOSIS — R059 Cough, unspecified: Secondary | ICD-10-CM | POA: Diagnosis not present

## 2024-01-08 LAB — BASIC METABOLIC PANEL
Anion gap: 10 (ref 5–15)
BUN: 11 mg/dL (ref 6–20)
CO2: 20 mmol/L — ABNORMAL LOW (ref 22–32)
Calcium: 9.7 mg/dL (ref 8.9–10.3)
Chloride: 107 mmol/L (ref 98–111)
Creatinine, Ser: 0.6 mg/dL (ref 0.44–1.00)
GFR, Estimated: >60 mL/min (ref >60)
Glucose, Bld: 149 mg/dL — ABNORMAL HIGH (ref 70–99)
Potassium: 3.9 mmol/L (ref 3.5–5.1)
Sodium: 137 mmol/L (ref 135–145)

## 2024-01-08 LAB — RESP PANEL BY RT-PCR (RSV, FLU A&B, COVID)  RVPGX2
Influenza A by PCR: NEGATIVE
Influenza B by PCR: NEGATIVE
Resp Syncytial Virus by PCR: NEGATIVE
SARS Coronavirus 2 by RT PCR: NEGATIVE

## 2024-01-08 LAB — HEPATIC FUNCTION PANEL
ALT: 14 U/L (ref 0–44)
AST: 16 U/L (ref 15–41)
Albumin: 4.5 g/dL (ref 3.5–5.0)
Alkaline Phosphatase: 57 U/L (ref 38–126)
Bilirubin, Direct: <0.1 mg/dL (ref 0.0–0.2)
Total Bilirubin: 1 mg/dL (ref 0.0–1.2)
Total Protein: 8.1 g/dL (ref 6.5–8.1)

## 2024-01-08 LAB — TROPONIN I (HIGH SENSITIVITY)
Troponin I (High Sensitivity): 4 ng/L (ref <18)
Troponin I (High Sensitivity): 3 ng/L (ref <18)

## 2024-01-08 LAB — LIPASE, BLOOD: Lipase: 33 U/L (ref 11–51)

## 2024-01-08 LAB — CBC
HCT: 53.6 % — ABNORMAL HIGH (ref 36.0–46.0)
Hemoglobin: 18.6 g/dL — ABNORMAL HIGH (ref 12.0–15.0)
MCH: 32.1 pg (ref 26.0–34.0)
MCHC: 34.7 g/dL (ref 30.0–36.0)
MCV: 92.6 fL (ref 80.0–100.0)
Platelets: 251 10*3/uL (ref 150–400)
RBC: 5.79 MIL/uL — ABNORMAL HIGH (ref 3.87–5.11)
RDW: 13.3 % (ref 11.5–15.5)
WBC: 17.7 10*3/uL — ABNORMAL HIGH (ref 4.0–10.5)
nRBC: 0 % (ref 0.0–0.2)

## 2024-01-08 LAB — POC URINE PREG, ED: Preg Test, Ur: NEGATIVE

## 2024-01-08 MED ORDER — ONDANSETRON HCL 4 MG/2ML IJ SOLN
4.0000 mg | Freq: Once | INTRAMUSCULAR | Status: AC
  Administered 2024-01-08: 4 mg via INTRAVENOUS
  Filled 2024-01-08: qty 2

## 2024-01-08 MED ORDER — SODIUM CHLORIDE 0.9 % IV SOLN
12.5000 mg | Freq: Once | INTRAVENOUS | Status: AC
  Administered 2024-01-08: 12.5 mg via INTRAVENOUS
  Filled 2024-01-08: qty 12.5

## 2024-01-08 MED ORDER — IOHEXOL 300 MG/ML  SOLN
80.0000 mL | Freq: Once | INTRAMUSCULAR | Status: AC | PRN
  Administered 2024-01-08: 80 mL via INTRAVENOUS

## 2024-01-08 MED ORDER — DICYCLOMINE HCL 10 MG PO CAPS
10.0000 mg | ORAL_CAPSULE | Freq: Once | ORAL | Status: AC
  Administered 2024-01-08: 10 mg via ORAL
  Filled 2024-01-08: qty 1

## 2024-01-08 MED ORDER — LORAZEPAM 2 MG/ML IJ SOLN
1.0000 mg | Freq: Once | INTRAMUSCULAR | Status: AC
  Administered 2024-01-08: 1 mg via INTRAVENOUS
  Filled 2024-01-08: qty 1

## 2024-01-08 MED ORDER — HYDROMORPHONE HCL 1 MG/ML IJ SOLN
0.5000 mg | Freq: Once | INTRAMUSCULAR | Status: AC
  Administered 2024-01-08: 0.5 mg via INTRAVENOUS
  Filled 2024-01-08: qty 0.5

## 2024-01-08 MED ORDER — PROMETHAZINE HCL 12.5 MG PO TABS: 12.5000 mg | ORAL_TABLET | Freq: Three times a day (TID) | ORAL | 0 refills | Status: DC | PRN

## 2024-01-08 MED ORDER — SODIUM CHLORIDE 0.9 % IV BOLUS
1000.0000 mL | Freq: Once | INTRAVENOUS | Status: AC
  Administered 2024-01-08: 1000 mL via INTRAVENOUS

## 2024-01-08 MED ORDER — ONDANSETRON 4 MG PO TBDP: 4.0000 mg | ORAL_TABLET | Freq: Three times a day (TID) | ORAL | 0 refills | Status: AC | PRN

## 2024-01-08 MED ORDER — DICYCLOMINE HCL 10 MG PO CAPS: 10.0000 mg | ORAL_CAPSULE | Freq: Three times a day (TID) | ORAL | 0 refills | Status: AC | PRN

## 2024-01-08 NOTE — ED Notes (Signed)
 See triage note  Presents with some chest tightness with some n/v  States sxs' started this am  Afebrile on arrival

## 2024-01-08 NOTE — Discharge Instructions (Addendum)
 We have prescribed both ondansetron (Zofran) and promethazine (Phenergan) for nausea.  You can try the Zofran first.  If it is not helping you may switch to the Phenergan.  Do not combine them.  We have also prescribed Bentyl for abdominal cramping and pain.  Slowly advance your diet over the next 1 or 2 days as your nausea improves.  Drink plenty of fluids.  Follow-up with your primary care provider.  Return to the ER for new, worsening, or persistent severe vomiting, abdominal or chest pain, weakness or lightheadedness, or any other new or worsening symptoms that concern you.

## 2024-01-08 NOTE — ED Provider Notes (Signed)
 [facility] Provider Note    Event Date/Time   First MD Initiated Contact with Patient 01/08/24 [phone]     (approximate)   History   Chest Pain   HPI  Dawn Grimes is a 38 y.o. female with a history of thyroid disease and mood disorder who presents with nausea and vomiting, acute onset this morning, associated with diarrhea.  The patient also reports pain in her chest which she describes as tightness.  She is felt her heart beating fast and irregularly.  She also reports some more generalized bodyaches and upper abdominal pain.  She has a mild cough but no shortness of breath.  I reviewed the past medical records.  The patient's most recent outpatient encounter was with [facility] on 12/11 for cough, nausea and vomiting.  She was treated for acute bronchitis.   Physical Exam   Triage Vital Signs: ED Triage Vitals  Encounter Vitals Group     BP 01/08/24 0710 (!) 141/92     Systolic BP Percentile --      Diastolic BP Percentile --      Pulse Rate 01/08/24 0710 93     Resp 01/08/24 0710 18     Temp 01/08/24 0710 98 F (36.7 C)     Temp src --      SpO2 01/08/24 0710 100 %     Weight 01/08/24 0708 160 lb (72.6 kg)     Height 01/08/24 0708 5\' 5"  (1.651 m)     Head Circumference --      Peak Flow --      Pain Score 01/08/24 0708 7     Pain Loc --      Pain Education --      Exclude from Growth Chart --     Most recent vital signs: Vitals:   01/08/24 0710 01/08/24 1126  BP: (!) 141/92 (!) 170/88  Pulse: 93 94  Resp: 18 18  Temp: 98 F (36.7 C) 98 F (36.7 C)  SpO2: 100% 100%     General: Awake, anxious appearing, no distress.  CV:  Good peripheral perfusion.  Normal heart sounds. Resp:  Normal effort.  Lungs CTAB. Abd:  Soft with no focal tenderness.  No distention.  Other:  No jaundice or scleral icterus.  Slightly dry mucous membranes.   ED Results / Procedures / Treatments   Labs (all labs ordered are listed, but only  abnormal results are displayed) Labs Reviewed  BASIC METABOLIC PANEL - Abnormal; Notable for the following components:      Result Value   CO2 20 (*)    Glucose, Bld 149 (*)    All other components within normal limits  CBC - Abnormal; Notable for the following components:   WBC 17.7 (*)    RBC 5.79 (*)    Hemoglobin 18.6 (*)    HCT 53.6 (*)    All other components within normal limits  RESP PANEL BY RT-PCR (RSV, FLU A&B, COVID)  RVPGX2  HEPATIC FUNCTION PANEL  LIPASE, BLOOD  POC URINE PREG, ED  TROPONIN I (HIGH SENSITIVITY)  TROPONIN I (HIGH SENSITIVITY)     EKG  ED ECG REPORT I, Dionne Bucy, the attending physician, personally viewed and interpreted this ECG.  Date: 01/08/2024 EKG Time: 0714 Rate: 92 Rhythm: normal sinus rhythm QRS Axis: normal Intervals: normal ST/T Wave abnormalities: normal Narrative Interpretation: no evidence of acute ischemia    RADIOLOGY  Chest x-ray: I independently viewed and interpreted the  images; there is no focal consolidation or edema   PROCEDURES:  Critical Care performed: No  Procedures   MEDICATIONS ORDERED IN ED: Medications  sodium chloride 0.9 % bolus 1,000 mL (0 mLs Intravenous Stopped 01/08/24 1037)  ondansetron (ZOFRAN) injection 4 mg (4 mg Intravenous Given 01/08/24 0817)  LORazepam (ATIVAN) injection 1 mg (1 mg Intravenous Given 01/08/24 0817)  HYDROmorphone (DILAUDID) injection 0.5 mg (0.5 mg Intravenous Given 01/08/24 0845)  sodium chloride 0.9 % bolus 1,000 mL (1,000 mLs Intravenous New Bag/Given 01/08/24 1042)  ondansetron (ZOFRAN) injection 4 mg (4 mg Intravenous Given 01/08/24 1156)  dicyclomine (BENTYL) capsule 10 mg (10 mg Oral Given 01/08/24 1331)  iohexol (OMNIPAQUE) 300 MG/ML solution 80 mL (80 mLs Intravenous Contrast Given 01/08/24 1142)  promethazine (PHENERGAN) 12.5 mg in sodium chloride 0.9 % 50 mL IVPB (12.5 mg Intravenous New Bag/Given 01/08/24 1330)     IMPRESSION / MDM / ASSESSMENT AND PLAN /  ED COURSE  I reviewed the triage vital signs and the nursing notes.  38 year old female with PMH as noted above presents with nausea, vomiting, diarrhea, as well as some chest discomfort, palpitations, body aches, all starting this morning.  On exam the patient is anxious but otherwise well-appearing.  Her vital signs are normal.  Physical exam is unremarkable for acute findings.  Differential diagnosis includes, but is not limited to, viral gastroenteritis, influenza, other viral syndrome, gastritis, GERD.  We will obtain basic labs, cardiac enzymes, respiratory panel, chest x-ray, and give fluids, Zofran, and Ativan.  Patient's presentation is most consistent with acute complicated illness / injury requiring diagnostic workup.  ----------------------------------------- 8:45 AM on 01/08/2024 -----------------------------------------  The patient states the nausea is better but still appears quite anxious and is reporting epigastric pain primarily.  She still has no significant tenderness.  I have added on LFTs and lipase and ordered some analgesia.  ----------------------------------------- 2:06 PM on 01/08/2024 -----------------------------------------  The patient started once again having persistent nausea and vomiting and recurrence of epigastric pain.  LFTs and lipase are unremarkable.  We obtained a CT for further evaluation and it is negative for acute findings.  The cystic structure seen on the patient's right ovary does not appear to be clinically relevant.  The patient has no pain in this area.  The patient had a few more episodes of vomiting.  She has now received Phenergan and states she is feeling better.  I did strongly consider whether the patient may benefit from inpatient admission given her persistent nausea and vomiting over the last 7 hours.  I offered this to the patient.  However, the patient would strongly prefer to go home.  She has been able to tolerate a small amount  of p.o. liquid and states she is feeling well enough to go home.  Given the reassuring lab workup and CT I feel that this is reasonable.  I counseled her on the results of the workup.  I have prescribed nausea medication and Bentyl for abdominal cramping.  I gave the patient strict return precautions and she expressed understanding.   FINAL CLINICAL IMPRESSION(S) / ED DIAGNOSES   Final diagnoses:  Nausea and vomiting, unspecified vomiting type  Epigastric pain     Rx / DC Orders   ED Discharge Orders          Ordered    ondansetron (ZOFRAN-ODT) 4 MG disintegrating tablet  Every 8 hours PRN        01/08/24 1404    promethazine (PHENERGAN) 12.5 MG tablet  Every 8 hours PRN        01/08/24 1404    dicyclomine (BENTYL) 10 MG capsule  Every 8 hours PRN        01/08/24 1404             Note:  This document was prepared using Dragon voice recognition software and may include unintentional dictation errors.    Dionne Bucy, MD 01/08/24 1408

## 2024-01-08 NOTE — ED Triage Notes (Signed)
 Pt comes with c/o cp, vomiting that all started this morning. Pt states mid sternal cp. Pt state tightness in chest. Pt states cough little but she is smoker.

## 2024-02-22 ENCOUNTER — Ambulatory Visit
Admission: EM | Admit: 2024-02-22 | Discharge: 2024-02-22 | Disposition: A | Discharge status: Home / Self Care | Attending: Physician Assistant | Admitting: Physician Assistant

## 2024-02-22 DIAGNOSIS — B349 Viral infection, unspecified: Secondary | ICD-10-CM | POA: Diagnosis not present

## 2024-02-22 DIAGNOSIS — Z720 Tobacco use: Secondary | ICD-10-CM | POA: Diagnosis not present

## 2024-02-22 DIAGNOSIS — R051 Acute cough: Secondary | ICD-10-CM

## 2024-02-22 DIAGNOSIS — J45901 Unspecified asthma with (acute) exacerbation: Secondary | ICD-10-CM

## 2024-02-22 LAB — RESP PANEL BY RT-PCR (FLU A&B, COVID) ARPGX2
Influenza A by PCR: NEGATIVE
Influenza B by PCR: NEGATIVE
SARS Coronavirus 2 by RT PCR: NEGATIVE

## 2024-02-22 MED ORDER — PROMETHAZINE-DM 6.25-15 MG/5ML PO SYRP: 5.0000 mL | ORAL_SOLUTION | Freq: Four times a day (QID) | ORAL | 0 refills | Status: DC | PRN

## 2024-02-22 MED ORDER — PREDNISONE 10 MG PO TABS: ORAL_TABLET | ORAL | 0 refills | Status: DC

## 2024-02-22 NOTE — Discharge Instructions (Addendum)
-  As well as of COVID and flu test low back tomorrow.  Isolate till fever free 24 hours and feeling better if positive COVID or flu.  I will contact you tomorrow with any results.  - Start prednisone and cough meds/ -Use inhalers -Go to ER if worsening symptoms

## 2024-02-22 NOTE — ED Triage Notes (Signed)
 Pt c/o cough & head pressure x2 days. Has tried OTC meds w/o relief.

## 2024-02-22 NOTE — ED Provider Notes (Signed)
 [facility]    CSN: [id number] Arrival date & time: 02/22/24  1957      History   Chief Complaint No chief complaint on file.   HPI Clairissa Valvano is a 38 y.o. female.   HPI  Past Medical History:  Diagnosis Date   Thyroid disease     There are no active problems to display for this patient.   Past Surgical History:  Procedure Laterality Date   CESAREAN SECTION     x2    OB History   No obstetric history on file.      Home Medications    Prior to Admission medications   Medication Sig Start Date End Date Taking? Authorizing Provider  albuterol (VENTOLIN HFA) 108 (90 Base) MCG/ACT inhaler Inhale 1-2 puffs into the lungs every 4 (four) hours as needed for wheezing or shortness of breath. 11/02/23   Katha Cabal, DO  amoxicillin-clavulanate (AUGMENTIN) 875-125 MG tablet Take 1 tablet by mouth every 12 (twelve) hours. 11/02/23   Brimage, Seward Meth, DO  busPIRone (BUSPAR) 10 MG tablet Take 20 mg by mouth 2 (two) times daily. 01/02/22   [provider]  dicyclomine (BENTYL) 10 MG capsule Take 1 capsule (10 mg total) by mouth every 8 (eight) hours as needed (abdominal pain or cramping). 01/08/24   Dionne Bucy, MD  divalproex (DEPAKOTE) 500 MG DR tablet Take 500 mg by mouth 2 (two) times daily.    [provider]  fluticasone (FLONASE) 50 MCG/ACT nasal spray Place 2 sprays into both nostrils daily. 08/16/23   Domenick Gong, MD  levothyroxine (SYNTHROID) 150 MCG tablet Take 150 mcg by mouth daily. 01/08/22   [provider]  mirtazapine (REMERON) 15 MG tablet Take by mouth. 07/15/23   [provider]  naproxen (NAPROSYN) 500 MG tablet Take 1 tablet (500 mg total) by mouth 2 (two) times daily. 08/16/23   Domenick Gong, MD  ondansetron (ZOFRAN-ODT) 4 MG disintegrating tablet Take 1 tablet (4 mg total) by mouth every 8 (eight) hours as needed. 01/08/24   Dionne Bucy, MD  promethazine (PHENERGAN) 12.5 MG tablet  Take 1 tablet (12.5 mg total) by mouth every 8 (eight) hours as needed for nausea or vomiting. 01/08/24   Dionne Bucy, MD  promethazine-dextromethorphan (PROMETHAZINE-DM) 6.25-15 MG/5ML syrup Take 5 mLs by mouth 4 (four) times daily as needed for cough. 11/02/23   Katha Cabal, DO  sertraline (ZOLOFT) 100 MG tablet Take by mouth. 01/11/22   [provider]  Spacer/Aero-Holding Chambers (AEROCHAMBER MV) inhaler Use as instructed 08/16/23   Domenick Gong, MD    Family History No family history on file.  Social History Social History   Tobacco Use   Smoking status: Every Day    Types: Cigarettes    Passive exposure: Never   Smokeless tobacco: Never  Vaping Use   Vaping status: Some Days  Substance Use Topics   Alcohol use: Yes    Comment: occ   Drug use: Not Currently     Allergies   Nickel and Sulfa antibiotics   Review of Systems Review of Systems  Constitutional:  Negative for chills, diaphoresis, fatigue and fever.  HENT:  Positive for congestion. Negative for ear pain, rhinorrhea, sinus pressure, sinus pain and sore throat.   Respiratory:  Positive for cough. Negative for shortness of breath.   Gastrointestinal:  Negative for abdominal pain, nausea and vomiting.  Musculoskeletal:  Negative for arthralgias and myalgias.  Skin:  Negative for rash.  Neurological:  Negative for weakness  and headaches.  Hematological:  Negative for adenopathy.     Physical Exam Triage Vital Signs ED Triage Vitals  Encounter Vitals Group     BP      Systolic BP Percentile      Diastolic BP Percentile      Pulse      Resp      Temp      Temp src      SpO2      Weight      Height      Head Circumference      Peak Flow      Pain Score      Pain Loc      Pain Education      Exclude from Growth Chart    No data found.  Updated Vital Signs There were no vitals taken for this visit.   Physical Exam Vitals and nursing note reviewed.  Constitutional:       General: She is not in acute distress.    Appearance: Normal appearance. She is not ill-appearing or toxic-appearing.  HENT:     Head: Normocephalic and atraumatic.     Nose: Nose normal.     Mouth/Throat:     Mouth: Mucous membranes are moist.     Pharynx: Oropharynx is clear.  Eyes:     General: No scleral icterus.       Right eye: No discharge.        Left eye: No discharge.     Conjunctiva/sclera: Conjunctivae normal.  Cardiovascular:     Rate and Rhythm: Normal rate and regular rhythm.     Heart sounds: Normal heart sounds.  Pulmonary:     Effort: Pulmonary effort is normal. No respiratory distress.     Breath sounds: Normal breath sounds.  Musculoskeletal:     Cervical back: Neck supple.  Skin:    General: Skin is dry.  Neurological:     General: No focal deficit present.     Mental Status: She is alert. Mental status is at baseline.     Motor: No weakness.     Gait: Gait normal.  Psychiatric:        Mood and Affect: Mood normal.        Behavior: Behavior normal.        Thought Content: Thought content normal.      UC Treatments / Results  Labs (all labs ordered are listed, but only abnormal results are displayed) Labs Reviewed - No data to display  EKG   Radiology No results found.  Procedures Procedures (including critical care time)  Medications Ordered in UC Medications - No data to display  Initial Impression / Assessment and Plan / UC Course  I have reviewed the triage vital signs and the nursing notes.  Pertinent labs & imaging results that were available during my care of the patient were reviewed by me and considered in my medical decision making (see chart for details).     *** Final Clinical Impressions(s) / UC Diagnoses   Final diagnoses:  None   Discharge Instructions   None    ED Prescriptions   None    PDMP not reviewed this encounter.

## 2024-02-23 ENCOUNTER — Encounter: Payer: Self-pay | Admitting: Physician Assistant

## 2024-03-22 ENCOUNTER — Encounter: Payer: Self-pay | Admitting: Emergency Medicine

## 2024-03-22 ENCOUNTER — Ambulatory Visit
Admission: EM | Admit: 2024-03-22 | Discharge: 2024-03-22 | Disposition: A | Discharge status: Home / Self Care | Attending: Emergency Medicine | Admitting: Emergency Medicine

## 2024-03-22 DIAGNOSIS — R638 Other symptoms and signs concerning food and fluid intake: Secondary | ICD-10-CM | POA: Insufficient documentation

## 2024-03-22 DIAGNOSIS — L299 Pruritus, unspecified: Secondary | ICD-10-CM | POA: Insufficient documentation

## 2024-03-22 DIAGNOSIS — J45909 Unspecified asthma, uncomplicated: Secondary | ICD-10-CM | POA: Insufficient documentation

## 2024-03-22 DIAGNOSIS — R109 Unspecified abdominal pain: Secondary | ICD-10-CM | POA: Insufficient documentation

## 2024-03-22 DIAGNOSIS — O99810 Abnormal glucose complicating pregnancy: Secondary | ICD-10-CM | POA: Insufficient documentation

## 2024-03-22 DIAGNOSIS — Z113 Encounter for screening for infections with a predominantly sexual mode of transmission: Secondary | ICD-10-CM | POA: Insufficient documentation

## 2024-03-22 DIAGNOSIS — O009 Unspecified ectopic pregnancy without intrauterine pregnancy: Secondary | ICD-10-CM | POA: Insufficient documentation

## 2024-03-22 DIAGNOSIS — D696 Thrombocytopenia, unspecified: Secondary | ICD-10-CM | POA: Insufficient documentation

## 2024-03-22 DIAGNOSIS — O24419 Gestational diabetes mellitus in pregnancy, unspecified control: Secondary | ICD-10-CM | POA: Insufficient documentation

## 2024-03-22 DIAGNOSIS — O4692 Antepartum hemorrhage, unspecified, second trimester: Secondary | ICD-10-CM | POA: Insufficient documentation

## 2024-03-22 LAB — URINALYSIS, W/ REFLEX TO CULTURE (INFECTION SUSPECTED)
Bilirubin Urine: NEGATIVE
Glucose, UA: NEGATIVE mg/dL
Hgb urine dipstick: NEGATIVE
Ketones, ur: NEGATIVE mg/dL
Leukocytes,Ua: NEGATIVE
Nitrite: NEGATIVE
Protein, ur: NEGATIVE mg/dL
RBC / HPF: NONE SEEN RBC/hpf (ref 0–5)
Specific Gravity, Urine: 1.015 (ref 1.005–1.030)
WBC, UA: NONE SEEN WBC/hpf (ref 0–5)
pH: 8.5 — ABNORMAL HIGH (ref 5.0–8.0)

## 2024-03-22 LAB — HIV ANTIBODY (ROUTINE TESTING W REFLEX): HIV Screen 4th Generation wRfx: NONREACTIVE

## 2024-03-22 LAB — PREGNANCY, URINE: Preg Test, Ur: NEGATIVE

## 2024-03-22 NOTE — ED Triage Notes (Signed)
 Pt presents with abdominal cramping that started yesterday. She also c/o dark urine. She also wants STD testing.

## 2024-03-22 NOTE — ED Provider Notes (Signed)
 [facility]    CSN: [id number] Arrival date & time: 03/22/24  0903      History   Chief Complaint Chief Complaint  Patient presents with   Abdominal Cramping    HPI Dawn Grimes is a 38 y.o. female.   38 year old female, Dawn Grimes, presents to urgent care for evaluation of abdominal cramping that started yesterday.  Patient also complaining of dark urine at times.  Patient requesting STD testing/preg test as she recently had a new partner, patient denies any vaginal discharge fever nausea vomiting or dysuria.   The history is provided by the patient. No language interpreter was used.    Past Medical History:  Diagnosis Date   Thyroid disease     Patient Active Problem List   Diagnosis Date Noted   Ectopic pregnancy 03/22/2024   Gestational diabetes mellitus 03/22/2024   Impaired glucose tolerance in pregnancy 03/22/2024   Increased body mass index 03/22/2024   Postpartum state 03/22/2024   Pruritic disorder 03/22/2024   Second trimester bleeding 03/22/2024   Asthma 03/22/2024   Thrombocytopenic disorder (HCC) 03/22/2024   Abdominal cramping 03/22/2024   Routine screening for STI (sexually transmitted infection) 03/22/2024   Marijuana use 06/08/2023   Stimulant use disorder 06/08/2023   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/01/2023   Fall 05/28/2023   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   Viral URI 12/08/2021   Sleep apnea 08/27/2021   Thrombocytopenia affecting pregnancy (HCC) 06/29/2021   History of insulin controlled gestational diabetes mellitus (GDM) 06/23/2021   Anxiety 06/16/2021   History of cesarean section 03/05/2021   Ovarian cyst 03/05/2021   Prediabetes 01/16/2021   Unspecified mood (affective) disorder (HCC) 01/16/2021   Major depressive disorder 07/28/2020   Hypothyroidism 12/07/2019   Panic attack 12/07/2019   Neck pain 09/21/2019   Tobacco user 09/21/2019    Past Surgical History:  Procedure Laterality Date    CESAREAN SECTION     x2    OB History   No obstetric history on file.      Home Medications    Prior to Admission medications   Medication Sig Start Date End Date Taking? Authorizing Provider  budesonide-formoterol (SYMBICORT) 160-4.5 MCG/ACT inhaler Inhale 2 puffs into the lungs. 07/22/23 07/21/24 Yes [provider]  albuterol  (VENTOLIN  HFA) 108 (90 Base) MCG/ACT inhaler Inhale 1-2 puffs into the lungs every 4 (four) hours as needed for wheezing or shortness of breath. 11/02/23   Brimage, Vondra, DO  busPIRone (BUSPAR) 10 MG tablet Take 20 mg by mouth 2 (two) times daily. 01/02/22   [provider]  dicyclomine  (BENTYL ) 10 MG capsule Take 1 capsule (10 mg total) by mouth every 8 (eight) hours as needed (abdominal pain or cramping). 01/08/24   Siadecki, Sebastian, MD  divalproex (DEPAKOTE) 500 MG DR tablet Take 500 mg by mouth 2 (two) times daily.    [provider]  fluticasone  (FLONASE ) 50 MCG/ACT nasal spray Place 2 sprays into both nostrils daily. 08/16/23   Mortenson, Ashley, MD  levothyroxine (SYNTHROID) 150 MCG tablet Take 150 mcg by mouth daily. 01/08/22   [provider]  mirtazapine (REMERON) 15 MG tablet Take by mouth. 07/15/23   [provider]  naproxen  (NAPROSYN ) 500 MG tablet Take 1 tablet (500 mg total) by mouth 2 (two) times daily. 08/16/23   Ethlyn Herd, MD  ondansetron  (ZOFRAN -ODT) 4 MG disintegrating tablet Take 1 tablet (4 mg total) by mouth every 8 (eight) hours as needed. 01/08/24   Lind Repine,  MD  predniSONE  (DELTASONE ) 10 MG tablet Take 6 tabs p.o. on day 1 and decrease by 1 tablet daily until complete 02/22/24   Floydene Hy, PA-C  promethazine  (PHENERGAN ) 12.5 MG tablet Take 1 tablet (12.5 mg total) by mouth every 8 (eight) hours as needed for nausea or vomiting. 01/08/24   Siadecki, Sebastian, MD  promethazine -dextromethorphan (PROMETHAZINE -DM) 6.25-15 MG/5ML syrup Take 5 mLs by mouth 4 (four) times daily as  needed. 02/22/24   Floydene Hy, PA-C  sertraline (ZOLOFT) 100 MG tablet Take by mouth. 01/11/22   [provider]  Spacer/Aero-Holding Idelle Majors (AEROCHAMBER MV) inhaler Use as instructed 08/16/23   Ethlyn Herd, MD  spironolactone (ALDACTONE) 50 MG tablet Take 1.5 tablets by mouth daily.    [provider]    Family History No family history on file.  Social History Social History   Tobacco Use   Smoking status: Every Day    Types: Cigarettes    Passive exposure: Never   Smokeless tobacco: Never  Vaping Use   Vaping status: Former  Substance Use Topics   Alcohol use: Yes    Comment: occ   Drug use: Not Currently     Allergies   Nickel and Sulfa antibiotics   Review of Systems Review of Systems  Constitutional:  Negative for fever.  Gastrointestinal:        "Cramping"  Genitourinary:  Negative for dysuria, pelvic pain and vaginal discharge.       "Dark urine at times"  All other systems reviewed and are negative.    Physical Exam Triage Vital Signs ED Triage Vitals  Encounter Vitals Group     BP 03/22/24 0925 (!) 143/98     Systolic BP Percentile --      Diastolic BP Percentile --      Pulse Rate 03/22/24 0925 94     Resp 03/22/24 0925 18     Temp 03/22/24 0925 99.1 F (37.3 C)     Temp Source 03/22/24 0925 Oral     SpO2 03/22/24 0925 96 %     Weight --      Height --      Head Circumference --      Peak Flow --      Pain Score 03/22/24 0921 2     Pain Loc --      Pain Education --      Exclude from Growth Chart --    No data found.  Updated Vital Signs BP (!) 143/98 (BP Location: Right Arm)   Pulse 94   Temp 99.1 F (37.3 C) (Oral)   Resp 18   LMP 03/14/2024 (Approximate)   SpO2 96%   Visual Acuity Right Eye Distance:   Left Eye Distance:   Bilateral Distance:    Right Eye Near:   Left Eye Near:    Bilateral Near:     Physical Exam Vitals and nursing note reviewed.  Constitutional:      General: She is not in  acute distress.    Appearance: She is well-developed and well-groomed.  HENT:     Head: Normocephalic and atraumatic.  Eyes:     Conjunctiva/sclera: Conjunctivae normal.  Cardiovascular:     Rate and Rhythm: Normal rate and regular rhythm.     Heart sounds: Normal heart sounds. No murmur heard. Pulmonary:     Effort: Pulmonary effort is normal. No respiratory distress.     Breath sounds: Normal breath sounds and air entry.  Abdominal:  Palpations: Abdomen is soft.     Tenderness: There is no abdominal tenderness.  Musculoskeletal:        General: No swelling.     Cervical back: Neck supple.  Skin:    General: Skin is warm and dry.     Capillary Refill: Capillary refill takes less than 2 seconds.  Neurological:     General: No focal deficit present.     Mental Status: She is alert and oriented to person, place, and time.     GCS: GCS eye subscore is 4. GCS verbal subscore is 5. GCS motor subscore is 6.  Psychiatric:        Attention and Perception: Attention normal.        Mood and Affect: Mood normal.        Speech: Speech normal.        Behavior: Behavior normal. Behavior is cooperative.      UC Treatments / Results  Labs (all labs ordered are listed, but only abnormal results are displayed) Labs Reviewed  URINALYSIS, W/ REFLEX TO CULTURE (INFECTION SUSPECTED) - Abnormal; Notable for the following components:      Result Value   pH 8.5 (*)    Bacteria, UA RARE (*)    All other components within normal limits  PREGNANCY, URINE  HIV ANTIBODY (ROUTINE TESTING W REFLEX)  RPR  CERVICOVAGINAL ANCILLARY ONLY    EKG   Radiology No results found.  Procedures Procedures (including critical care time)  Medications Ordered in UC Medications - No data to display  Initial Impression / Assessment and Plan / UC Course  I have reviewed the triage vital signs and the nursing notes.  Pertinent labs & imaging results that were available during my care of the patient  were reviewed by me and considered in my medical decision making (see chart for details).  Clinical Course as of 03/22/24 1226  Thu Mar 22, 2024  0919 Urine preg, aptima swab, UA due to dark urine/abdominal cramping x 2 days:  pt requesting STD testing as well including blood work [JD]  1024 Urine preg is negative [JD]  1033 UA is negative fo nitrates,leuks, WBC or RBC, no UTI. [JD]    Clinical Course User Index [JD] Dalaya Suppa, Eveleen Hinds, NP  Discussed exam findings and plan of care with patient, aware results will display in MyChart, if additional treatment is required she will be notified and treatment will be offered.  Strict go to ER precautions given, patient verbalized understanding this provider, safe sex precautions.  Ddx: Routine STI testing, STI, Abdominal cramping Final Clinical Impressions(s) / UC Diagnoses   Final diagnoses:  Abdominal cramping  Routine screening for STI (sexually transmitted infection)     Discharge Instructions      Your urine was negative for UTI,dehydration or pregnancy. Check my chart for results. Avoid sexual activity until results,treatment known and completed. Safe sex with all future sexual activity. We have sent testing for sexually transmitted infections. We will notify you of any positive results once they are received. If required, we will prescribe any medications you might need.   Follow up with PCP. Return as needed.  If you develop worsening abdominal pain or new symptoms go to the emergency room for further evaluation    ED Prescriptions   None    PDMP not reviewed this encounter.   Peter Brands, NP 03/22/24 1226

## 2024-03-22 NOTE — Discharge Instructions (Addendum)
 Your urine was negative for UTI,dehydration or pregnancy. Check my chart for results. Avoid sexual activity until results,treatment known and completed. Safe sex with all future sexual activity. We have sent testing for sexually transmitted infections. We will notify you of any positive results once they are received. If required, we will prescribe any medications you might need.   Follow up with PCP. Return as needed.  If you develop worsening abdominal pain or new symptoms go to the emergency room for further evaluation

## 2024-03-23 LAB — CERVICOVAGINAL ANCILLARY ONLY
Bacterial Vaginitis (gardnerella): NEGATIVE
Candida Glabrata: NEGATIVE
Candida Vaginitis: NEGATIVE
Chlamydia: NEGATIVE
Comment: NEGATIVE
Comment: NEGATIVE
Comment: NEGATIVE
Comment: NEGATIVE
Comment: NEGATIVE
Comment: NORMAL
Neisseria Gonorrhea: NEGATIVE
Trichomonas: NEGATIVE

## 2024-03-23 LAB — RPR: RPR Ser Ql: NONREACTIVE

## 2024-10-13 ENCOUNTER — Encounter: Payer: Self-pay | Admitting: Emergency Medicine

## 2024-10-13 ENCOUNTER — Ambulatory Visit
Admission: EM | Admit: 2024-10-13 | Discharge: 2024-10-13 | Disposition: A | Discharge status: Home / Self Care | Attending: Physician Assistant | Admitting: Physician Assistant

## 2024-10-13 DIAGNOSIS — R21 Rash and other nonspecific skin eruption: Secondary | ICD-10-CM | POA: Diagnosis not present

## 2024-10-13 DIAGNOSIS — M5412 Radiculopathy, cervical region: Secondary | ICD-10-CM | POA: Diagnosis not present

## 2024-10-13 DIAGNOSIS — M654 Radial styloid tenosynovitis [de Quervain]: Secondary | ICD-10-CM | POA: Diagnosis not present

## 2024-10-13 DIAGNOSIS — G894 Chronic pain syndrome: Secondary | ICD-10-CM

## 2024-10-13 DIAGNOSIS — L719 Rosacea, unspecified: Secondary | ICD-10-CM

## 2024-10-13 MED ORDER — TIZANIDINE HCL 4 MG PO TABS: 4.0000 mg | ORAL_TABLET | Freq: Every evening | ORAL | 0 refills | Status: AC | PRN

## 2024-10-13 MED ORDER — DOXYCYCLINE HYCLATE 100 MG PO CAPS: 100.0000 mg | ORAL_CAPSULE | Freq: Two times a day (BID) | ORAL | 0 refills | Status: AC

## 2024-10-13 MED ORDER — PREDNISONE 10 MG PO TABS: ORAL_TABLET | ORAL | 0 refills | Status: AC
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
## 2024-10-13 NOTE — Discharge Instructions (Signed)
-   Your symptoms are consistent with tendinitis of your wrist and also concerning for cervical radiculopathy causing pain throughout your arm. - I sent prednisone  taper to pharmacy.  Muscle relaxer at night.  May continue Tylenol . - Use the wrist brace as needed. - If not improving over the next week or 2 please follow-up with [facility]. - I sent doxycycline  as needed for your rash as it has helped in the past.
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
## 2024-10-27 ENCOUNTER — Ambulatory Visit: Admission: EM | Admit: 2024-10-27 | Discharge: 2024-10-27 | Disposition: A | Discharge status: Home / Self Care

## 2024-10-27 ENCOUNTER — Encounter: Payer: Self-pay | Admitting: Emergency Medicine

## 2024-10-27 DIAGNOSIS — M6790 Unspecified disorder of synovium and tendon, unspecified site: Secondary | ICD-10-CM | POA: Diagnosis not present

## 2024-10-27 NOTE — ED Provider Notes (Signed)
 [facility] [place] [facility] [place]   Name: Dawn Grimes DOB: November 25, 1985 MRN: [id number] CSN: [id number] PCP: [facility]  Arrival date and time:  10/27/24 1557  Chief Complaint:  Wrist Pain (right)   NOTE: Prior to seeing the patient today, I have reviewed the triage nursing documentation and vital signs. Clinical staff has updated patient's PMH/PSHx, current medication list, and drug allergies/intolerances to ensure comprehensive history available to assist in medical decision making.   History:   HPI: Dawn Grimes is a 38 y.o. female who presents today alone with complaints of continued wrist pain.  Patient states this is a chronic problem with an acute flare.  She works as a advertising copywriter and she has continuous motions with her right wrist which causes worsening pain.  She has tried a carpal tunnel brace during work but it makes it difficult for her to complete her job duties.  She treats the pain with over-the-counter ibuprofen as needed and a muscle relaxer at nighttime which she states somewhat helps.  Of note, patient was seen at this facility 2 weeks ago and was instructed to follow-up with an orthopedic provider.  Unfortunately she has yet to follow-up with her orthopedic provider as she does not have the ability to see 1 at this point.   Past Medical History:  Diagnosis Date   Thyroid disease     Past Surgical History:  Procedure Laterality Date   CESAREAN SECTION     x2    History reviewed. No pertinent family history.  Social History   Tobacco Use   Smoking status: Every Day    Types: Cigarettes    Passive exposure: Never   Smokeless tobacco: Never  Vaping Use   Vaping status: Former  Substance Use Topics   Alcohol use: Yes    Comment: occ   Drug use: Not Currently    Patient Active Problem List   Diagnosis Date Noted   Ectopic pregnancy 03/22/2024   Gestational diabetes mellitus 03/22/2024   Impaired glucose tolerance in pregnancy 03/22/2024    Increased body mass index 03/22/2024   Postpartum state 03/22/2024   Pruritic disorder 03/22/2024   Second trimester bleeding 03/22/2024   Asthma 03/22/2024   Thrombocytopenic disorder 03/22/2024   Abdominal cramping 03/22/2024   Routine screening for STI (sexually transmitted infection) 03/22/2024   Marijuana use 06/08/2023   Stimulant use disorder 06/08/2023   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/01/2023   Fall 05/28/2023   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   Viral URI 12/08/2021   Sleep apnea 08/27/2021   Thrombocytopenia affecting pregnancy 06/29/2021   History of insulin controlled gestational diabetes mellitus (GDM) 06/23/2021   Anxiety 06/16/2021   History of cesarean section 03/05/2021   Ovarian cyst 03/05/2021   Prediabetes 01/16/2021   Unspecified mood (affective) disorder 01/16/2021   Major depressive disorder 07/28/2020   Hypothyroidism 12/07/2019   Panic attack 12/07/2019   Neck pain 09/21/2019   Tobacco user 09/21/2019    Home Medications:    No outpatient medications have been marked as taking for the 10/27/24 encounter [facility] Encounter).    Allergies:   Nickel and Sulfa antibiotics  Review of Systems (ROS): Review of Systems  Musculoskeletal:  Positive for arthralgias.  Skin:  Negative for rash.  All other systems reviewed and are negative.    Vital Signs: Today's Vitals   10/27/24 1606 10/27/24 1607 10/27/24 1608  BP:   (!) 148/89  Pulse:   76  Resp:   15  Temp:   98.5 F (36.9 C)  TempSrc:   Oral  SpO2:   98%  Weight:  182 lb 1.6 oz (82.6 kg)   Height:  5' 5 (1.651 m)   PainSc: 2       Physical Exam: Physical Exam Vitals and nursing note reviewed.  Constitutional:      Appearance: Normal appearance.  Cardiovascular:     Rate and Rhythm: Normal rate and regular rhythm.     Pulses: Normal pulses.     Heart sounds: Normal heart sounds.  Pulmonary:     Breath sounds: Normal breath sounds.  Musculoskeletal:      Right wrist: Tenderness present. No bony tenderness, snuff box tenderness or crepitus. Normal range of motion. Normal pulse.     Left wrist: No tenderness, bony tenderness or snuff box tenderness. Normal range of motion.     Comments: Tendon knots noted to ulnar aspect of right wrist.  Skin:    General: Skin is warm and dry.  Neurological:     Mental Status: She is alert.  Psychiatric:        Mood and Affect: Mood normal.        Behavior: Behavior normal.        Thought Content: Thought content normal.      Urgent Care Treatments / Results:   LABS: PLEASE NOTE: all labs that were ordered this encounter are listed, however only abnormal results are displayed. Labs Reviewed - No data to display  EKG: -None  RADIOLOGY: No results found.  PROCEDURES: Procedures  MEDICATIONS RECEIVED THIS VISIT: Medications - No data to display  PERTINENT CLINICAL COURSE NOTES/UPDATES:   Initial Impression / Assessment and Plan / Urgent Care Course:  Pertinent labs & imaging results that were available during my care of the patient were personally reviewed by me and considered in my medical decision making (see lab/imaging section of note for values and interpretations).  [name] is a 38 y.o. female who presents to [facility] today with complaints of right wrist pain, diagnosed with tendinopathy of the right wrist, and treated as such with the directions below. NP and patient reviewed discharge instructions below during visit.   Patient is well appearing overall in clinic today. She does not appear to be in any acute distress. Presenting symptoms (see HPI) and exam as documented above.   I have reviewed the follow up and strict return precautions for any new or worsening symptoms. Patient is aware of symptoms that would be deemed urgent/emergent, and would thus require further evaluation either here or in the emergency department. At the time of discharge, she verbalized  understanding and consent with the discharge plan as it was reviewed with her. All questions were fielded by provider and/or clinic staff prior to patient discharge.    Final Clinical Impressions / Urgent Care Diagnoses:   Final diagnoses:  Tendinopathy    New Prescriptions:  [place] Controlled Substance Registry consulted? Not Applicable  No orders of the defined types were placed in this encounter.     Discharge Instructions      You were seen for right wrist pain and are being treated for tendinopathy of the right wrist.   -Take 2 naproxen  up to 2 times a day with food.  You can continue to use muscle relaxers at nighttime. - I strongly encourage you to follow-up with orthopedic specialist for further treatment and evaluation of your wrist. - Use a structured splint while sleeping and compression glove  while working.  Take care, Dr. Morene, NP-c     Recommended Follow up Care:  Patient encouraged to follow up with the following provider within the specified time frame, or sooner as dictated by the severity of her symptoms. As always, she was instructed that for any urgent/emergent care needs, she should seek care either here or in the emergency department for more immediate evaluation.   Marylu Morene, DNP, NP-c   Morene Marylu, NP 10/27/24 [phone]

## 2024-10-27 NOTE — ED Triage Notes (Signed)
 Patient c/o ongoing right wrist pain for 2 weeks.  Patient did not go see an Orthopedist.

## 2024-10-27 NOTE — Discharge Instructions (Signed)
 You were seen for right wrist pain and are being treated for tendinopathy of the right wrist.   -Take 2 naproxen  up to 2 times a day with food.  You can continue to use muscle relaxers at nighttime. - I strongly encourage you to follow-up with orthopedic specialist for further treatment and evaluation of your wrist. - Use a structured splint while sleeping and compression glove while working.  Take care, Dr. Morene, NP-c
